# Patient Record
Sex: Male | Born: 1960 | Race: White | Hispanic: No | Marital: Married | Smoking: Former smoker
Health system: Southern US, Community
[De-identification: ages and names within clinical notes are randomized; demographics above are authoritative.]

## PROBLEM LIST (undated history)

## (undated) DIAGNOSIS — E785 Hyperlipidemia, unspecified: Secondary | ICD-10-CM

## (undated) HISTORY — DX: Hyperlipidemia, unspecified: E78.5

## (undated) HISTORY — PX: KNEE SURGERY: SHX244

---

## 2009-08-29 ENCOUNTER — Ambulatory Visit: Payer: Self-pay | Admitting: Cardiology

## 2009-08-29 ENCOUNTER — Observation Stay (HOSPITAL_COMMUNITY): Admission: EM | Admit: 2009-08-29 | Discharge: 2009-08-30 | Payer: Self-pay | Admitting: Emergency Medicine

## 2009-08-30 ENCOUNTER — Encounter (INDEPENDENT_AMBULATORY_CARE_PROVIDER_SITE_OTHER): Payer: Self-pay | Admitting: Emergency Medicine

## 2010-12-05 LAB — POCT CARDIAC MARKERS
CKMB, poc: <1 ng/mL — ABNORMAL LOW (ref 1.0–8.0)
Myoglobin, poc: 46.4 ng/mL (ref 12–200)
Troponin i, poc: <0.05 ng/mL (ref 0.00–0.09)

## 2010-12-05 LAB — CBC
HCT: 39.6 % (ref 39.0–52.0)
Platelets: 235 10*3/uL (ref 150–400)
RDW: 12.8 % (ref 11.5–15.5)

## 2010-12-05 LAB — POCT I-STAT, CHEM 8
Calcium, Ion: 1.18 mmol/L (ref 1.12–1.32)
Chloride: 108 mEq/L (ref 96–112)
HCT: 41 % (ref 39.0–52.0)
Potassium: 4.1 mEq/L (ref 3.5–5.1)
Sodium: 141 mEq/L (ref 135–145)

## 2012-01-06 ENCOUNTER — Encounter (HOSPITAL_COMMUNITY): Payer: Self-pay | Admitting: *Deleted

## 2012-01-06 ENCOUNTER — Emergency Department (HOSPITAL_COMMUNITY)
Admission: EM | Admit: 2012-01-06 | Discharge: 2012-01-06 | Disposition: A | Discharge status: Home / Self Care | Payer: 59 | Attending: Emergency Medicine | Admitting: Emergency Medicine

## 2012-01-06 DIAGNOSIS — S61219A Laceration without foreign body of unspecified finger without damage to nail, initial encounter: Secondary | ICD-10-CM

## 2012-01-06 DIAGNOSIS — S61209A Unspecified open wound of unspecified finger without damage to nail, initial encounter: Secondary | ICD-10-CM | POA: Insufficient documentation

## 2012-01-06 DIAGNOSIS — W278XXA Contact with other nonpowered hand tool, initial encounter: Secondary | ICD-10-CM | POA: Insufficient documentation

## 2012-01-06 MED ORDER — TETANUS-DIPHTH-ACELL PERTUSSIS 5-2.5-18.5 LF-MCG/0.5 IM SUSP
0.5000 mL | Freq: Once | INTRAMUSCULAR | Status: AC
  Administered 2012-01-06: 0.5 mL via INTRAMUSCULAR
  Filled 2012-01-06: qty 0.5

## 2012-01-06 NOTE — Discharge Instructions (Signed)

## 2012-01-06 NOTE — ED Notes (Signed)
To ED for eval of left first finger laceration while using a chop saw. Lac close to first joint of finger.

## 2012-01-06 NOTE — ED Provider Notes (Signed)
History   This chart was scribed for Nat Christen, MD by Melba Coon. The patient was seen in room STRE1/STRE1 and the patient's care was started at 5:04PM.    CSN: [id number]  Arrival date & time 01/06/12  1646   First MD Initiated Contact with Patient 01/06/12 1657      Chief Complaint  Patient presents with  . Laceration    (Consider location/radiation/quality/duration/timing/severity/associated sxs/prior treatment) HPI Tj Kitchings is a 51 y.o. male who presents to the Emergency Department complaining of constant, moderate to severe left index finger pain with an onset an horu ago. Pt was using a chop saw and accidentally cut himself. Pt has not taken pain meds at home. No HA, fever, neck pain, sore throat, rash, back pain, CP, SOB, abd pain, n/v/d, dysuria, or extremity weakness, numbness, or tingling. Tetanus shot not up-to-date. No known allergies. No other pertinent medical symptoms. Pt is right-handed.  History reviewed. No pertinent past medical history.  History reviewed. No pertinent past surgical history.  History reviewed. No pertinent family history.  History  Substance Use Topics  . Smoking status: Former Games developer  . Smokeless tobacco: Not on file  . Alcohol Use: Yes      Review of Systems 10 Systems reviewed and all are negative for acute change except as noted in the HPI.   Allergies  Review of patient's allergies indicates no known allergies.  Home Medications   Current Outpatient Rx  Name Route Sig Dispense Refill  . ALPRAZOLAM 0.5 MG PO TABS Oral Take 0.5 mg by mouth at bedtime as needed. For sleep    . ROSUVASTATIN CALCIUM 5 MG PO TABS Oral Take 5 mg by mouth daily.    . SERTRALINE HCL 100 MG PO TABS Oral Take 100 mg by mouth daily.    Marland Kitchen VITAMIN C 500 MG PO TABS Oral Take 500-1,000 mg by mouth daily. Takes 1 to 2 tablets based on how he feels.    Marland Kitchen ZOLPIDEM TARTRATE 5 MG PO TABS Oral Take 5 mg by mouth at bedtime as needed. For sleep       BP 144/86  Pulse 80  Temp(Src) 97.4 F (36.3 C) (Oral)  Resp 16  SpO2 97%  Physical Exam  Nursing note and vitals reviewed. Constitutional: He is oriented to person, place, and time. He appears well-developed and well-nourished. No distress.  HENT:  Head: Normocephalic and atraumatic.  Eyes: EOM are normal. Pupils are equal, round, and reactive to light.  Neck: Normal range of motion. Neck supple. No tracheal deviation present.  Cardiovascular: Normal rate.   No murmur heard. Pulmonary/Chest: Effort normal. No respiratory distress. He has no wheezes. He has no rales.  Abdominal: Soft. There is no tenderness.  Musculoskeletal: Normal range of motion. He exhibits tenderness (left 2nd digit).  Neurological: He is alert and oriented to person, place, and time.  Skin: Skin is warm and dry.       Left hand: 2-3 cm stellate laceration over his proximal phalanx on the dorsal side (more of a flap that just goes to the subcutaneous tissue); superficial 1 cm laceration that is proximal to the larger laceration  Psychiatric: He has a normal mood and affect. His behavior is normal.    ED Course  Procedures (including critical care time)  DIAGNOSTIC STUDIES: Oxygen Saturation is 97% on room air, normal by my interpretation.    COORDINATION OF CARE:  5:30PM - EDMD will perform a laceration repair for the pt. 5:48PM -  EDMD completed laceration repair which went well and was tolerated well by pt. EDMD will order tetanus shot for the pt.  LACERATION REPAIR Performed by: Faylene Million. Shaneca Orne, MD Consent: Verbal consent obtained. Risks and benefits: risks, benefits and alternatives were discussed Patient identity confirmed: provided demographic data Time out performed prior to procedure Prepped and Draped in normal sterile fashion Wound explored  Laceration Location: left 2nd digit  Laceration Length: 2-3 cm  No Foreign Bodies seen or palpated  Anesthesia: digital block  Local  anesthetic: lidocaine 2% w/o epinephrine  Anesthetic total: 6 ml  Irrigation method: syringe Amount of cleaning: standard  Skin closure: Chromic 4-0 P-3  Number of sutures or staples: 7  Technique: simple interrupted suturing  Patient tolerance: Patient tolerated the procedure well with no immediate complications.  This laceration was more complicated due to the stellate nature of the laceration.   Labs Reviewed - No data to display No results found.   No diagnosis found.    MDM  Patient had his laceration repaired.  Patient was given wound care instructions and precautions to return for worsening redness, pus range, fevers or increasing pain.  He will see his primary care physician later this week for reevaluation of the wound as well.  I personally performed the services described in this documentation, which was scribed in my presence. The recorded information has been reviewed and considered.     Nat Christen, MD 01/06/12 [phone]

## 2013-12-19 ENCOUNTER — Encounter: Payer: Self-pay | Admitting: Family Medicine

## 2013-12-24 ENCOUNTER — Ambulatory Visit (INDEPENDENT_AMBULATORY_CARE_PROVIDER_SITE_OTHER): Payer: 59 | Admitting: Family Medicine

## 2013-12-24 ENCOUNTER — Encounter: Payer: Self-pay | Admitting: Family Medicine

## 2013-12-24 ENCOUNTER — Encounter (INDEPENDENT_AMBULATORY_CARE_PROVIDER_SITE_OTHER): Payer: Self-pay

## 2013-12-24 VITALS — BP 122/82 | HR 88 | Ht 72.0 in | Wt 220.0 lb

## 2013-12-24 DIAGNOSIS — M25559 Pain in unspecified hip: Secondary | ICD-10-CM

## 2013-12-24 DIAGNOSIS — M25571 Pain in right ankle and joints of right foot: Secondary | ICD-10-CM

## 2013-12-24 DIAGNOSIS — M76829 Posterior tibial tendinitis, unspecified leg: Secondary | ICD-10-CM

## 2013-12-24 DIAGNOSIS — M25551 Pain in right hip: Secondary | ICD-10-CM

## 2013-12-24 DIAGNOSIS — M25579 Pain in unspecified ankle and joints of unspecified foot: Secondary | ICD-10-CM

## 2013-12-24 MED ORDER — NITROGLYCERIN 0.2 MG/HR TD PT24: MEDICATED_PATCH | TRANSDERMAL | Status: AC

## 2013-12-24 NOTE — Patient Instructions (Signed)
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 Ice over area of pain 3-4 times a day for 15 minutes at a time Hip side raises, standing hip rotations 3 sets of 10 once a day - add ankle weight if these become too easy. Stretches - pick 2 and hold for 20-30 seconds x 3 - do once or twice a day. Tylenol and/or aleve as needed for pain. If not improving, can consider formal physical therapy - call me if you want to go ahead with this. When you return to running start slow with walk: jog 1 minute to 1 minute and increase from there.  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 Arch supports are very important - start with ones like Dr. Jari SportsmanScholls active series (something with plastic or more rigid arch but cushioned in the heel and forefoot). Calf raises and theraband internal rotation 3 sets of 10 once a day. Nitro patches - 1/4th patch over affected area, change daily. Icing, tylenol/aleve as needed for pain. Follow up with me in 6 weeks for both issues.

## 2013-12-25 ENCOUNTER — Encounter: Payer: Self-pay | Admitting: Family Medicine

## 2013-12-25 DIAGNOSIS — M25551 Pain in right hip: Secondary | ICD-10-CM | POA: Insufficient documentation

## 2013-12-25 DIAGNOSIS — M79671 Pain in right foot: Secondary | ICD-10-CM | POA: Insufficient documentation

## 2013-12-25 NOTE — Progress Notes (Signed)
Patient ID: Ethan Morales, male   DOB: 03/21/1961, 53 y.o.   MRN: [id number]  PCP: Loetta RoughHANG,PAUL, MD  Subjective:   HPI: Patient is a 53 y.o. male here for right hip, foot pain.  1. Right hip pain Patient reports last January he started running again. About a year ago had a twinge in lateral right hip but improved from this. Came back about 7 months ago (september) without a new injury. Radiates from here down past knee. Not worse with lying on this side. No numbness/tingling. No back pain. Tried prednisone without much benefit.  2. Right foot pain Started a year ago when a piece of wood landed on medial right ankle/foot area. Saw ortho - dx with post tib tendinopathy - did rehab and injection and improved for several weeks to months. Then pain started to return without new injury.  Past Medical History  Diagnosis Date  . Hyperlipidemia     Current Outpatient Prescriptions on File Prior to Visit  Medication Sig Dispense Refill  . ALPRAZolam (XANAX) 0.5 MG tablet Take 0.5 mg by mouth at bedtime as needed. For sleep      . vitamin C (ASCORBIC ACID) 500 MG tablet Take 500-1,000 mg by mouth daily. Takes 1 to 2 tablets based on how he feels.      Marland Kitchen. zolpidem (AMBIEN) 5 MG tablet Take 5 mg by mouth at bedtime as needed. For sleep       No current facility-administered medications on file prior to visit.    Past Surgical History  Procedure Laterality Date  . Knee surgery Right     No Known Allergies  History   Social History  . Marital Status: Married    Spouse Name: N/A    Number of Children: N/A  . Years of Education: N/A   Occupational History  . Not on file.   Social History Main Topics  . Smoking status: Former Games developermoker  . Smokeless tobacco: Not on file  . Alcohol Use: Yes  . Drug Use: Not on file  . Sexual Activity: Not on file   Other Topics Concern  . Not on file   Social History Narrative  . No narrative on file    Family History  Problem Relation Age  of Onset  . Hypertension Mother   . Hypertension Father   . Hyperlipidemia Father   . Diabetes Neg Hx   . Heart attack Neg Hx   . Sudden death Neg Hx     BP 122/82  Pulse 88  Ht 6' (1.829 m)  Wt 220 lb (99.791 kg)  BMI 29.83 kg/m2  Review of Systems: See HPI above.    Objective:  Physical Exam:  Gen: NAD  Right hip/back: No gross deformity, scoliosis. No TTP trochanter, elsewhere about hip or back. FROM. Strength LEs 5/5 all muscle groups.  2+ MSRs in patellar and achilles tendons, equal bilaterally. Negative SLRs. Sensation intact to light touch bilaterally. Negative logroll bilateral hips Negative fabers and piriformis stretches. Tight IT band, positive obers.  Hip abduction 5/5.  Right foot/ankle: No gross deformity, swelling, ecchymoses FROM with mild pain on internal rotation. TTP mildly in medial arch and posterior to medial malleolus. Negative ant drawer and talar tilt.   Negative syndesmotic compression. Thompsons test negative. Cavus. NV intact distally.    Assessment & Plan:  1. Right hip pain - 2/2 IT band syndrome.  Declined formal physical therapy.  Reviewed strengthening and stretching exercises.  Handout provided.  Tylenol/aleve if needed.  Walk:jog program.  Icing as needed.  2. Posterior tibialis tendinopathy - Home theraband exercises, arch supports most important parts of treatment.  Start nitro patches as well (discussed risks of headaches, skin irritation).  Icing/tylenol/aleve as needed.  F/u in 6 weeks

## 2013-12-25 NOTE — Assessment & Plan Note (Signed)
Home theraband exercises, arch supports most important parts of treatment.  Start nitro patches as well (discussed risks of headaches, skin irritation).  Icing/tylenol/aleve as needed.  F/u in 6 weeks

## 2013-12-25 NOTE — Assessment & Plan Note (Signed)
2/2 IT band syndrome.  Declined formal physical therapy.  Reviewed strengthening and stretching exercises.  Handout provided.  Tylenol/aleve if needed.  Walk:jog program.  Icing as needed.  

## 2014-02-02 ENCOUNTER — Ambulatory Visit: Payer: 59 | Admitting: Family Medicine

## 2014-03-04 ENCOUNTER — Ambulatory Visit (INDEPENDENT_AMBULATORY_CARE_PROVIDER_SITE_OTHER): Payer: 59 | Admitting: Family Medicine

## 2014-03-04 ENCOUNTER — Encounter: Payer: Self-pay | Admitting: Family Medicine

## 2014-03-04 ENCOUNTER — Ambulatory Visit (HOSPITAL_BASED_OUTPATIENT_CLINIC_OR_DEPARTMENT_OTHER)
Admission: RE | Admit: 2014-03-04 | Discharge: 2014-03-04 | Disposition: A | Discharge status: Home / Self Care | Payer: 59 | Source: Ambulatory Visit | Attending: Family Medicine | Admitting: Family Medicine

## 2014-03-04 VITALS — BP 124/83 | HR 58 | Ht 72.0 in | Wt 220.0 lb

## 2014-03-04 DIAGNOSIS — M79609 Pain in unspecified limb: Secondary | ICD-10-CM

## 2014-03-04 DIAGNOSIS — M25559 Pain in unspecified hip: Secondary | ICD-10-CM

## 2014-03-04 DIAGNOSIS — M79671 Pain in right foot: Secondary | ICD-10-CM

## 2014-03-04 DIAGNOSIS — M25561 Pain in right knee: Secondary | ICD-10-CM

## 2014-03-04 DIAGNOSIS — M25569 Pain in unspecified knee: Secondary | ICD-10-CM

## 2014-03-04 DIAGNOSIS — M25551 Pain in right hip: Secondary | ICD-10-CM

## 2014-03-04 NOTE — Patient Instructions (Signed)
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 Ice over area of pain 3-4 times a day for 15 minutes at a time Hip side raises, standing hip rotations 3 sets of 10 once a day - add ankle weight if these become too easy. Stretches - pick 2 and hold for 20-30 seconds x 3 - do once or twice a day. Tylenol and/or aleve as needed for pain. If not improving, can consider formal physical therapy - call me if you want to go ahead with this. When you return to running start slow with walk: jog 1 minute to 1 minute and increase from there.  You have posterior tibialis tendinopathy and plantar fasciitis. Arch supports are very important - start with ones like Dr. Jari SportsmanScholls active series (something with plastic or more rigid arch but cushioned in the heel and forefoot). Calf raises on a step and theraband internal rotation 3 sets of 10 once a day. Stop the nitro patches for now. Arch binder regularly when up and walking around. Icing, tylenol/aleve as needed for pain. Follow up with me in 6 weeks for these issues.  Your knee pain is due to a contusion, small hematoma. Unfortunately there isn't a lot you can do for this - icing as needed, ibuprofen or aleve. Compression is a consideration. I'd avoid deep lunges and squats for now.

## 2014-03-09 ENCOUNTER — Encounter: Payer: Self-pay | Admitting: Family Medicine

## 2014-03-09 DIAGNOSIS — M25561 Pain in right knee: Secondary | ICD-10-CM | POA: Insufficient documentation

## 2014-03-09 NOTE — Assessment & Plan Note (Signed)
2/2 IT band syndrome.  Declined formal physical therapy.  Reviewed strengthening and stretching exercises.  Handout provided.  Tylenol/aleve if needed.  Walk:jog program.  Icing as needed.

## 2014-03-09 NOTE — Assessment & Plan Note (Signed)
Posterior tibialis tendinopathy, plantar fasciitis - Home theraband exercises, arch supports, arch binders.  Stop nitro patches as most of his issues now plantar fasciitis.  Icing/tylenol/aleve as needed.  F/u in 6 weeks

## 2014-03-09 NOTE — Assessment & Plan Note (Signed)
2/2 small hematoma.  Should resolve with time.  Icing, tylenol/nsaids, compression.  Avoid deep squats, lunges.

## 2014-03-09 NOTE — Progress Notes (Signed)
Patient ID: Ethan Morales, male   DOB: 05/24/1961, 53 y.o.   MRN: [id number]  PCP: Loetta RoughHANG,PAUL, MD  Subjective:   HPI: Patient is a 53 y.o. male here for right knee, hip, foot pain.  4/22: 1. Right hip pain Patient reports last January he started running again. About a year ago had a twinge in lateral right hip but improved from this. Came back about 7 months ago (september) without a new injury. Radiates from here down past knee. Not worse with lying on this side. No numbness/tingling. No back pain. Tried prednisone without much benefit.  2. Right foot pain Started a year ago when a piece of wood landed on medial right ankle/foot area. Saw ortho - dx with post tib tendinopathy - did rehab and injection and improved for several weeks to months. Then pain started to return without new injury.  7/1: Patient admittedly has not been doing home exercises regularly. No new injuries to hip or foot. Has been using arch support for post tib issues. He sustained new right knee injury at end of April - was breaking branches over this knee. Doesn't recall anything in particular while doing this but day after was sore, couldn't do steps, had aching in patellar area. Has been icing.  Past Medical History  Diagnosis Date  . Hyperlipidemia     Current Outpatient Prescriptions on File Prior to Visit  Medication Sig Dispense Refill  . ALPRAZolam (XANAX) 0.5 MG tablet Take 0.5 mg by mouth at bedtime as needed. For sleep      . atorvastatin (LIPITOR) 10 MG tablet       . nitroGLYCERIN (NITRO-DUR) 0.2 mg/hr patch 1/4th patch over affected area, change daily  30 patch  1  . vitamin C (ASCORBIC ACID) 500 MG tablet Take 500-1,000 mg by mouth daily. Takes 1 to 2 tablets based on how he feels.      Marland Kitchen. zolpidem (AMBIEN) 5 MG tablet Take 5 mg by mouth at bedtime as needed. For sleep       No current facility-administered medications on file prior to visit.    Past Surgical History  Procedure  Laterality Date  . Knee surgery Right     No Known Allergies  History   Social History  . Marital Status: Married    Spouse Name: N/A    Number of Children: N/A  . Years of Education: N/A   Occupational History  . Not on file.   Social History Main Topics  . Smoking status: Former Games developermoker  . Smokeless tobacco: Not on file  . Alcohol Use: Yes  . Drug Use: Not on file  . Sexual Activity: Not on file   Other Topics Concern  . Not on file   Social History Narrative  . No narrative on file    Family History  Problem Relation Age of Onset  . Hypertension Mother   . Hypertension Father   . Hyperlipidemia Father   . Diabetes Neg Hx   . Heart attack Neg Hx   . Sudden death Neg Hx     BP 124/83  Pulse 58  Ht 6' (1.829 m)  Wt 220 lb (99.791 kg)  BMI 29.83 kg/m2  Review of Systems: See HPI above.    Objective:  Physical Exam:  Gen: NAD  Right hip/back: No gross deformity, scoliosis. No TTP trochanter, elsewhere about hip or back. FROM. Strength LEs 5/5 all muscle groups.  2+ MSRs in patellar and achilles tendons, equal bilaterally. Negative SLRs. Sensation  intact to light touch bilaterally. Negative logroll bilateral hips Negative fabers and piriformis stretches. Tight IT band, positive obers.  Hip abduction 5/5.  Right foot/ankle: No gross deformity, swelling, ecchymoses FROM with mild pain on internal rotation. TTP mildly in medial arch; no longer posterior to medial malleolus. Negative ant drawer and talar tilt.   Negative syndesmotic compression. Thompsons test negative. Cavus. NV intact distally.    R knee: Small mobile tender mass medial aspect of patella.  No bruising, other deformity. No joint line, other tenderness. FROM with pain on full flexion. Negative ant/post drawers. Negative valgus/varus testing. Negative lachmanns. Negative mcmurrays, apleys, patellar apprehension. NV intact distally.  MSK u/s:  Small hematoma in area of pain  right knee.  Assessment & Plan:  1. Right hip pain - 2/2 IT band syndrome.  Declined formal physical therapy.  Reviewed strengthening and stretching exercises.  Handout provided.  Tylenol/aleve if needed.  Walk:jog program.  Icing as needed.  2. Posterior tibialis tendinopathy, plantar fasciitis - Home theraband exercises, arch supports, arch binders.  Stop nitro patches as most of his issues now plantar fasciitis.  Icing/tylenol/aleve as needed.  F/u in 6 weeks  3. Right knee pain - 2/2 small hematoma.  Should resolve with time.  Icing, tylenol/nsaids, compression.  Avoid deep squats, lunges.

## 2014-04-13 ENCOUNTER — Encounter: Payer: Self-pay | Admitting: Family Medicine

## 2014-04-13 ENCOUNTER — Ambulatory Visit (INDEPENDENT_AMBULATORY_CARE_PROVIDER_SITE_OTHER): Payer: 59 | Admitting: Family Medicine

## 2014-04-13 VITALS — BP 132/86 | HR 66 | Ht 72.0 in | Wt 220.0 lb

## 2014-04-13 DIAGNOSIS — M79671 Pain in right foot: Secondary | ICD-10-CM

## 2014-04-13 DIAGNOSIS — M25569 Pain in unspecified knee: Secondary | ICD-10-CM

## 2014-04-13 DIAGNOSIS — M25551 Pain in right hip: Secondary | ICD-10-CM

## 2014-04-13 DIAGNOSIS — M25561 Pain in right knee: Secondary | ICD-10-CM

## 2014-04-13 DIAGNOSIS — M79609 Pain in unspecified limb: Secondary | ICD-10-CM

## 2014-04-13 DIAGNOSIS — M25559 Pain in unspecified hip: Secondary | ICD-10-CM

## 2014-04-14 ENCOUNTER — Encounter: Payer: Self-pay | Admitting: Family Medicine

## 2014-04-14 NOTE — Assessment & Plan Note (Signed)
2/2 IT band syndrome.  Continue HEP.  Tylenol/aleve if needed.  Consider physical therapy.

## 2014-04-14 NOTE — Assessment & Plan Note (Signed)
2/2 small hematoma.  Resolved.

## 2014-04-14 NOTE — Progress Notes (Signed)
Patient ID: Ethan MylarGeorge Morales, male   DOB: 07/29/1961, 53 y.o.   MRN: [id number]  PCP: Loetta RoughHANG,PAUL, MD  Subjective:   HPI: Patient is a 53 y.o. male here for right knee, hip, foot pain.  4/22: 1. Right hip pain Patient reports last January he started running again. About a year ago had a twinge in lateral right hip but improved from this. Came back about 7 months ago (september) without a new injury. Radiates from here down past knee. Not worse with lying on this side. No numbness/tingling. No back pain. Tried prednisone without much benefit.  2. Right foot pain Started a year ago when a piece of wood landed on medial right ankle/foot area. Saw ortho - dx with post tib tendinopathy - did rehab and injection and improved for several weeks to months. Then pain started to return without new injury.  7/1: Patient admittedly has not been doing home exercises regularly. No new injuries to hip or foot. Has been using arch support for post tib issues. He sustained new right knee injury at end of April - was breaking branches over this knee. Doesn't recall anything in particular while doing this but day after was sore, couldn't do steps, had aching in patellar area. Has been icing.  8/10: Patient reports all issues are better - no problems with knee at this time. Right hip bothers him some - once a week will be troublesome. Doing home exercises and stretches now. Right arch still bothers him too - doing HEP, using arch supports and arch binders.  Past Medical History  Diagnosis Date  . Hyperlipidemia     Current Outpatient Prescriptions on File Prior to Visit  Medication Sig Dispense Refill  . ALPRAZolam (XANAX) 0.5 MG tablet Take 0.5 mg by mouth at bedtime as needed. For sleep      . atorvastatin (LIPITOR) 10 MG tablet       . nitroGLYCERIN (NITRO-DUR) 0.2 mg/hr patch 1/4th patch over affected area, change daily  30 patch  1  . vitamin C (ASCORBIC ACID) 500 MG tablet Take 500-1,000  mg by mouth daily. Takes 1 to 2 tablets based on how he feels.      Marland Kitchen. zolpidem (AMBIEN) 5 MG tablet Take 5 mg by mouth at bedtime as needed. For sleep       No current facility-administered medications on file prior to visit.    Past Surgical History  Procedure Laterality Date  . Knee surgery Right     No Known Allergies  History   Social History  . Marital Status: Married    Spouse Name: N/A    Number of Children: N/A  . Years of Education: N/A   Occupational History  . Not on file.   Social History Main Topics  . Smoking status: Former Games developermoker  . Smokeless tobacco: Not on file  . Alcohol Use: Yes  . Drug Use: Not on file  . Sexual Activity: Not on file   Other Topics Concern  . Not on file   Social History Narrative  . No narrative on file    Family History  Problem Relation Age of Onset  . Hypertension Mother   . Hypertension Father   . Hyperlipidemia Father   . Diabetes Neg Hx   . Heart attack Neg Hx   . Sudden death Neg Hx     BP 132/86  Pulse 66  Ht 6' (1.829 m)  Wt 220 lb (99.791 kg)  BMI 29.83 kg/m2  Review of  Systems: See HPI above.    Objective:  Physical Exam:  Gen: NAD  Right hip: No gross deformity, scoliosis. No TTP trochanter. FROM. Strength LEs 5/5 all muscle groups.   Right foot/ankle: No gross deformity, swelling, ecchymoses FROM without pain TTP insertion of plantar fascia on heel and within body of plantar fascia.  No other tenderness. Negative ant drawer and talar tilt.   Negative syndesmotic compression. Thompsons test negative. Cavus. NV intact distally.    R knee: No bruising, swelling, deformity. No joint line, other tenderness. FROM without pain. Negative ant/post drawers. Negative valgus/varus testing. Negative lachmanns. Negative apleys, patellar apprehension. NV intact distally.  Assessment & Plan:  1. Right hip pain - 2/2 IT band syndrome.  Continue HEP.  Tylenol/aleve if needed.  Consider physical  therapy.    2. Posterior tibialis tendinopathy, plantar fasciitis - Tendinopathy improved.  He will continue with HEP, arch supports, arch binders.  Icing/tylenol/aleve as needed.  F/u prn.  [facility] physical therapy, custom orthotics, injection.  3. Right knee pain - 2/2 small hematoma.  Resolved.

## 2014-04-14 NOTE — Assessment & Plan Note (Signed)
Posterior tibialis tendinopathy, plantar fasciitis - Tendinopathy improved.  He will continue with HEP, arch supports, arch binders.  Icing/tylenol/aleve as needed.  F/u prn.  [facility] physical therapy, custom orthotics, injection.

## 2015-11-18 IMAGING — CR DG KNEE AP/LAT W/ SUNRISE*R*
2 series · 2 of 2 positions shown · non-contrast
Comparison: None.

CLINICAL DATA: Right knee pain for 5 weeks

EXAM:
DG KNEE - 3 VIEWS

[t knee ap right]
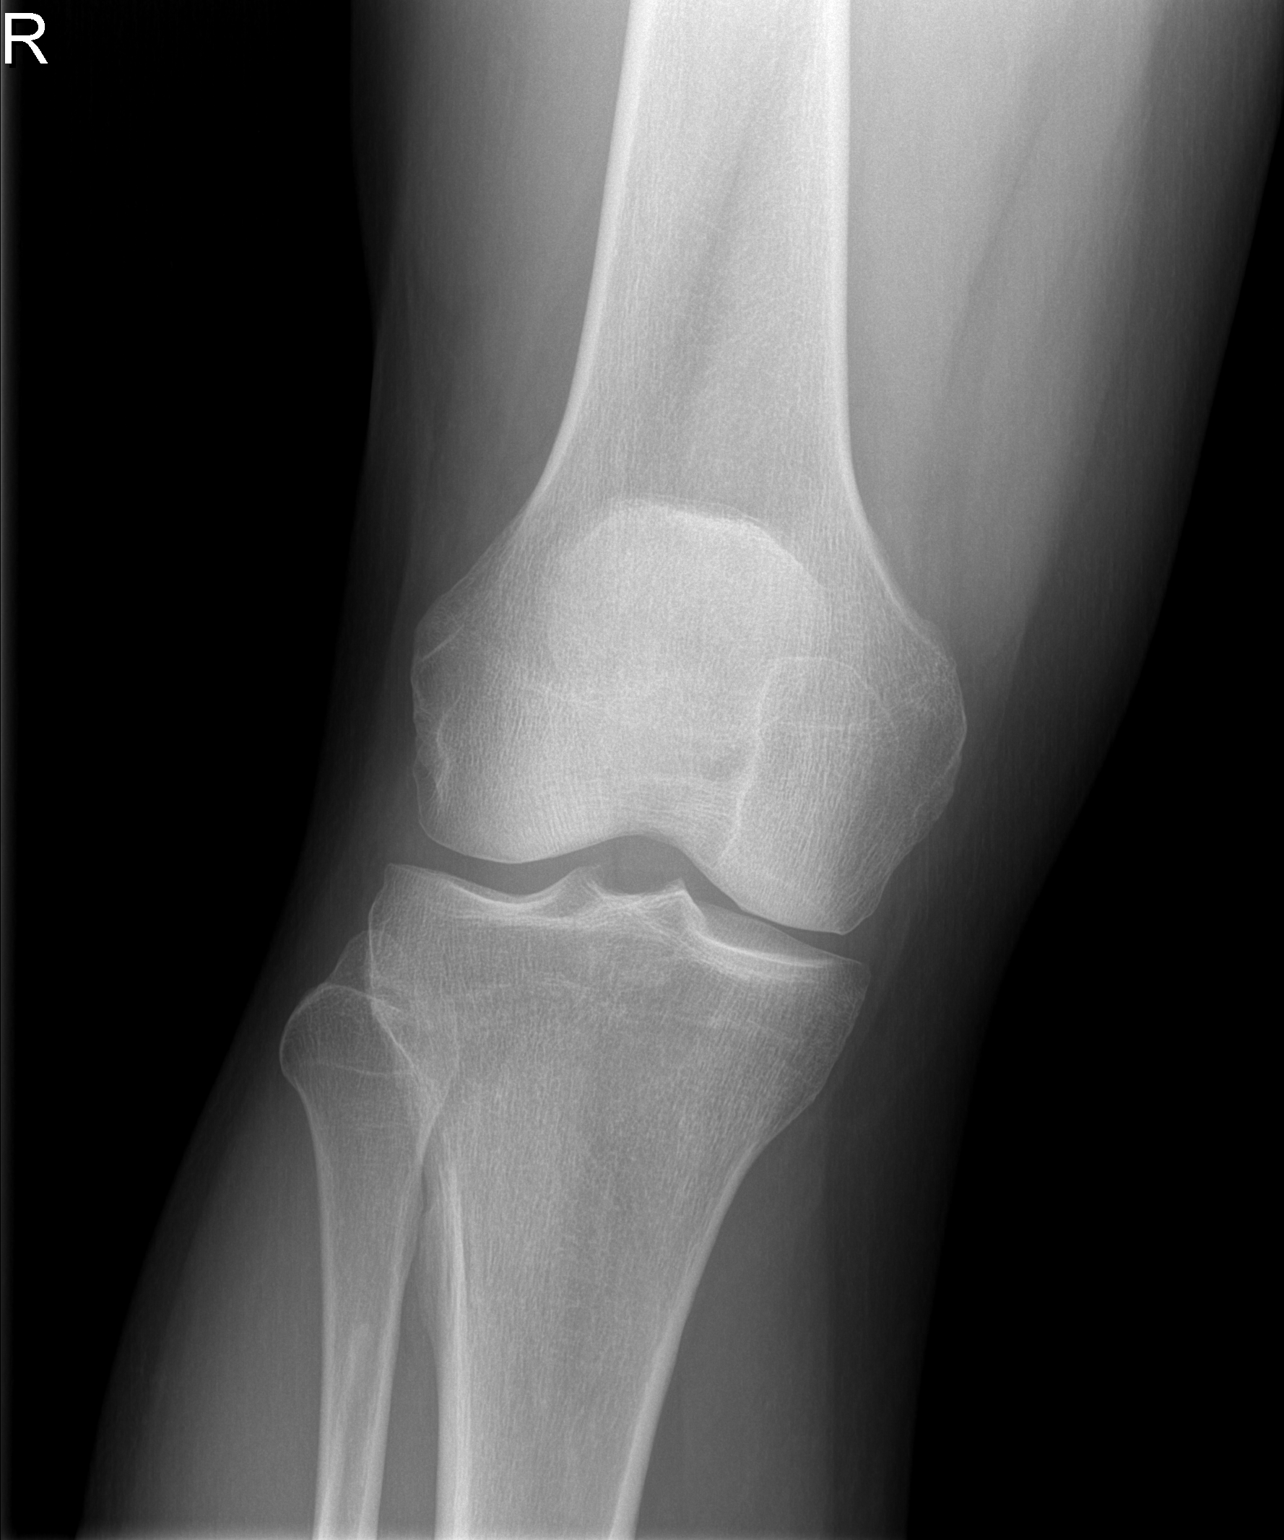

[t knee lat right]
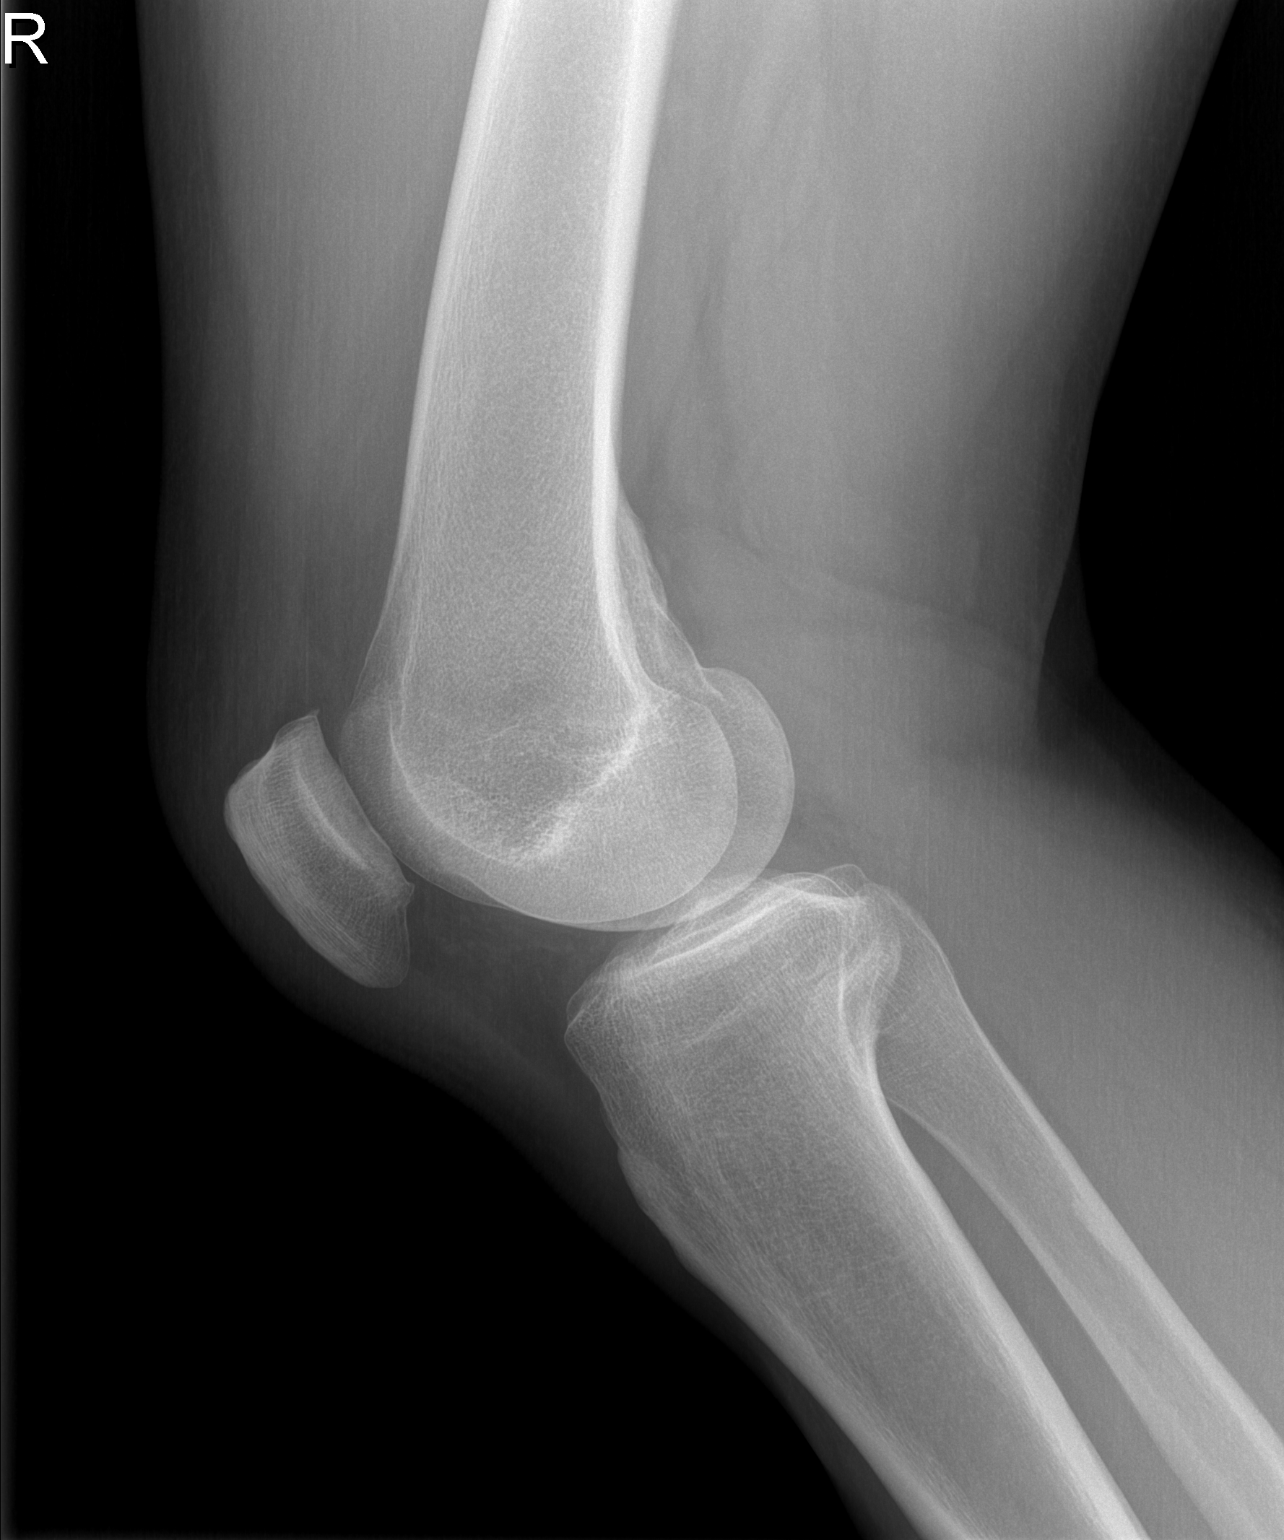

[2 of 2 positions shown; findings below may reference images not displayed]

FINDINGS: There is very minimal degenerative spurring from the patella femoral
articulation. The medial and lateral compartments are normal with no
significant degenerative change noted. No fracture is seen and no
effusion is noted. The patella is normally positioned within the
patellofemoral groove.
IMPRESSION: No acute abnormality. Minimal spurring from the patella femoral
articulation.
